# Patient Record
Sex: Male | Born: 1955 | Race: White | Hispanic: No | Marital: Married | Smoking: Current every day smoker
Health system: Southern US, Community
[De-identification: ages and names within clinical notes are randomized; demographics above are authoritative.]

## PROBLEM LIST (undated history)

## (undated) DIAGNOSIS — F1010 Alcohol abuse, uncomplicated: Secondary | ICD-10-CM

## (undated) HISTORY — PX: ORTHOPEDIC SURGERY: SHX850

## (undated) HISTORY — PX: INGUINAL HERNIA REPAIR: SUR1180

---

## 1998-10-26 ENCOUNTER — Encounter: Admission: RE | Admit: 1998-10-26 | Discharge: 1998-10-26 | Payer: Self-pay | Admitting: *Deleted

## 2000-12-20 ENCOUNTER — Encounter: Payer: Self-pay | Admitting: Family Medicine

## 2000-12-20 ENCOUNTER — Ambulatory Visit (HOSPITAL_COMMUNITY): Admission: RE | Admit: 2000-12-20 | Discharge: 2000-12-20 | Payer: Self-pay | Admitting: Family Medicine

## 2003-10-28 ENCOUNTER — Emergency Department (HOSPITAL_COMMUNITY): Admission: EM | Admit: 2003-10-28 | Discharge: 2003-10-28 | Payer: Self-pay | Admitting: Emergency Medicine

## 2015-02-27 ENCOUNTER — Emergency Department (HOSPITAL_COMMUNITY)
Admission: EM | Admit: 2015-02-27 | Discharge: 2015-02-28 | Disposition: A | Discharge status: Home / Self Care | Payer: Self-pay | Attending: Emergency Medicine | Admitting: Emergency Medicine

## 2015-02-27 ENCOUNTER — Encounter (HOSPITAL_COMMUNITY): Payer: Self-pay

## 2015-02-27 ENCOUNTER — Emergency Department (HOSPITAL_COMMUNITY): Payer: Self-pay

## 2015-02-27 DIAGNOSIS — J441 Chronic obstructive pulmonary disease with (acute) exacerbation: Secondary | ICD-10-CM | POA: Insufficient documentation

## 2015-02-27 DIAGNOSIS — F17200 Nicotine dependence, unspecified, uncomplicated: Secondary | ICD-10-CM | POA: Insufficient documentation

## 2015-02-27 DIAGNOSIS — F1020 Alcohol dependence, uncomplicated: Secondary | ICD-10-CM | POA: Insufficient documentation

## 2015-02-27 DIAGNOSIS — Z720 Tobacco use: Secondary | ICD-10-CM

## 2015-02-27 DIAGNOSIS — R079 Chest pain, unspecified: Secondary | ICD-10-CM | POA: Insufficient documentation

## 2015-02-27 HISTORY — DX: Alcohol abuse, uncomplicated: F10.10

## 2015-02-27 LAB — RAPID URINE DRUG SCREEN, HOSP PERFORMED
AMPHETAMINES: NOT DETECTED
BARBITURATES: NOT DETECTED
BENZODIAZEPINES: NOT DETECTED
COCAINE: NOT DETECTED
Opiates: NOT DETECTED
TETRAHYDROCANNABINOL: NOT DETECTED

## 2015-02-27 LAB — BASIC METABOLIC PANEL
Anion gap: 10 (ref 5–15)
BUN: 6 mg/dL (ref 6–20)
CHLORIDE: 102 mmol/L (ref 101–111)
CO2: 28 mmol/L (ref 22–32)
CREATININE: 0.71 mg/dL (ref 0.61–1.24)
Calcium: 9.4 mg/dL (ref 8.9–10.3)
GFR calc Af Amer: >60 mL/min (ref >60)
GFR calc non Af Amer: >60 mL/min (ref >60)
GLUCOSE: 91 mg/dL (ref 65–99)
POTASSIUM: 4.5 mmol/L (ref 3.5–5.1)
Sodium: 140 mmol/L (ref 135–145)

## 2015-02-27 LAB — CBC WITH DIFFERENTIAL/PLATELET
Basophils Absolute: 0.2 10*3/uL — ABNORMAL HIGH (ref 0.0–0.1)
Basophils Relative: 2 %
EOS ABS: 0.2 10*3/uL (ref 0.0–0.7)
EOS PCT: 3 %
HCT: 42.2 % (ref 39.0–52.0)
HEMOGLOBIN: 14.7 g/dL (ref 13.0–17.0)
LYMPHS ABS: 2.8 10*3/uL (ref 0.7–4.0)
LYMPHS PCT: 33 %
MCH: 32.2 pg (ref 26.0–34.0)
MCHC: 34.8 g/dL (ref 30.0–36.0)
MCV: 92.5 fL (ref 78.0–100.0)
MONOS PCT: 9 %
Monocytes Absolute: 0.8 10*3/uL (ref 0.1–1.0)
Neutro Abs: 4.4 10*3/uL (ref 1.7–7.7)
Neutrophils Relative %: 53 %
PLATELETS: 260 10*3/uL (ref 150–400)
RBC: 4.56 MIL/uL (ref 4.22–5.81)
RDW: 13.2 % (ref 11.5–15.5)
WBC: 8.3 10*3/uL (ref 4.0–10.5)

## 2015-02-27 LAB — TROPONIN I: Troponin I: <0.03 ng/mL (ref <0.031)

## 2015-02-27 LAB — ETHANOL: Alcohol, Ethyl (B): 336 mg/dL (ref <5)

## 2015-02-27 MED ORDER — FENTANYL CITRATE (PF) 100 MCG/2ML IJ SOLN
50.0000 ug | Freq: Once | INTRAMUSCULAR | Status: AC
  Administered 2015-02-27: 50 ug via INTRAVENOUS
  Filled 2015-02-27: qty 2

## 2015-02-27 NOTE — Discharge Instructions (Signed)
Call the [facility] [place] at [phone] to get a primary care doctor. Your test for a heart attack was negative tonight.

## 2015-02-27 NOTE — ED Notes (Signed)
Per EMS, called out for pt having SOB. Pt then said he has chest pain and a headache.

## 2015-02-27 NOTE — ED Provider Notes (Addendum)
CSN: [id number]     Arrival date & time 02/27/15  1631 History   First MD Initiated Contact with Patient 02/27/15 1636     Chief Complaint  Patient presents with  . Chest Pain     (Consider location/radiation/quality/duration/timing/severity/associated sxs/prior Treatment) HPI..... Sharp central chest pain for 36 hours with some radiation to the right shoulder. He is chronically short of breath secondary to COPD from smoking 2 packs of cigarettes per day. He is also heavy drinker. No history of heart disease. He claims to be homeless.  Past Medical History  Diagnosis Date  . ETOH abuse    Past Surgical History  Procedure Laterality Date  . Orthopedic surgery    . Inguinal hernia repair     No family history on file. Social History  Substance Use Topics  . Smoking status: Current Every Day Smoker  . Smokeless tobacco: None  . Alcohol Use: Yes    Review of Systems  All other systems reviewed and are negative.     Allergies  Review of patient's allergies indicates no known allergies.  Home Medications   Prior to Admission medications   Not on File   BP 147/99 mmHg  Pulse 80  Temp(Src) 97.9 F (36.6 C) (Oral)  Resp 16  Ht 6\' 1"  (1.854 m)  Wt 140 lb (63.504 kg)  BMI 18.47 kg/m2  SpO2 100% Physical Exam  Constitutional: He is oriented to person, place, and time. He appears well-developed and well-nourished.  HENT:  Head: Normocephalic and atraumatic.  Eyes: Conjunctivae and EOM are normal. Pupils are equal, round, and reactive to light.  Neck: Normal range of motion. Neck supple.  Cardiovascular: Normal rate and regular rhythm.   Pulmonary/Chest: Effort normal and breath sounds normal.  Abdominal: Soft. Bowel sounds are normal.  Musculoskeletal: Normal range of motion.  Neurological: He is alert and oriented to person, place, and time.  Skin: Skin is warm and dry.  Psychiatric: He has a normal mood and affect. His behavior is normal.  Nursing note and vitals  reviewed.   ED Course  Procedures (including critical care time) Labs Review Labs Reviewed  CBC WITH DIFFERENTIAL/PLATELET - Abnormal; Notable for the following:    Basophils Absolute 0.2 (*)    All other components within normal limits  ETHANOL - Abnormal; Notable for the following:    Alcohol, Ethyl (B) 336 (*)    All other components within normal limits  BASIC METABOLIC PANEL  TROPONIN I  URINE RAPID DRUG SCREEN, HOSP PERFORMED    Imaging Review Dg Chest Port 1 View  02/27/2015  CLINICAL DATA:  Shortness of breath.  Left chest pain.  Smoker. EXAM: PORTABLE CHEST 1 VIEW COMPARISON:  None. FINDINGS: Borderline enlarged cardiac silhouette. Clear lungs with normal vascularity. The lungs are mildly hyperexpanded. Mild scoliosis. IMPRESSION: No acute abnormality. Borderline cardiomegaly and mild changes of COPD. Electronically Signed   By: Beckie SaltsSteven  Reid M.D.   On: 02/27/2015 17:41   I have personally reviewed and evaluated these images and lab results as part of my medical decision-making.   EKG Interpretation   Date/Time:  Saturday February 27 2015 16:36:27 EST Ventricular Rate:  66 PR Interval:  147 QRS Duration: 83 QT Interval:  401 QTC Calculation: 420 R Axis:   83 Text Interpretation:  Sinus rhythm Anterior infarct, old Confirmed by Omarius Grantham   MD, Alois Mincer ([id number]) on 02/27/2015 4:39:59 PM      MDM   Final diagnoses:  Chest pain, unspecified chest pain type  Alcoholism (HCC)  Tobacco abuse    Screening tests including EKG and troponin were negative. Alcohol level was 336. After patient slept for a couple hours, he was pain-free. He will seek primary care follow-up    Donnetta Hutching, MD 02/27/15 [id number]  Donnetta Hutching, MD 02/28/15 [phone]

## 2015-02-27 NOTE — ED Notes (Signed)
Received report on pt, pt alert, requesting something to drink, denies any pain, update given,

## 2015-02-28 NOTE — ED Notes (Signed)
Pt discharged to lobby to wait for morning,

## 2017-03-29 IMAGING — CR DG CHEST 1V PORT
1 series · 1 of 1 positions shown · non-contrast
Comparison: None.

CLINICAL DATA: Shortness of breath.  Left chest pain.  Smoker.

EXAM:
PORTABLE CHEST 1 VIEW

[ap portable]
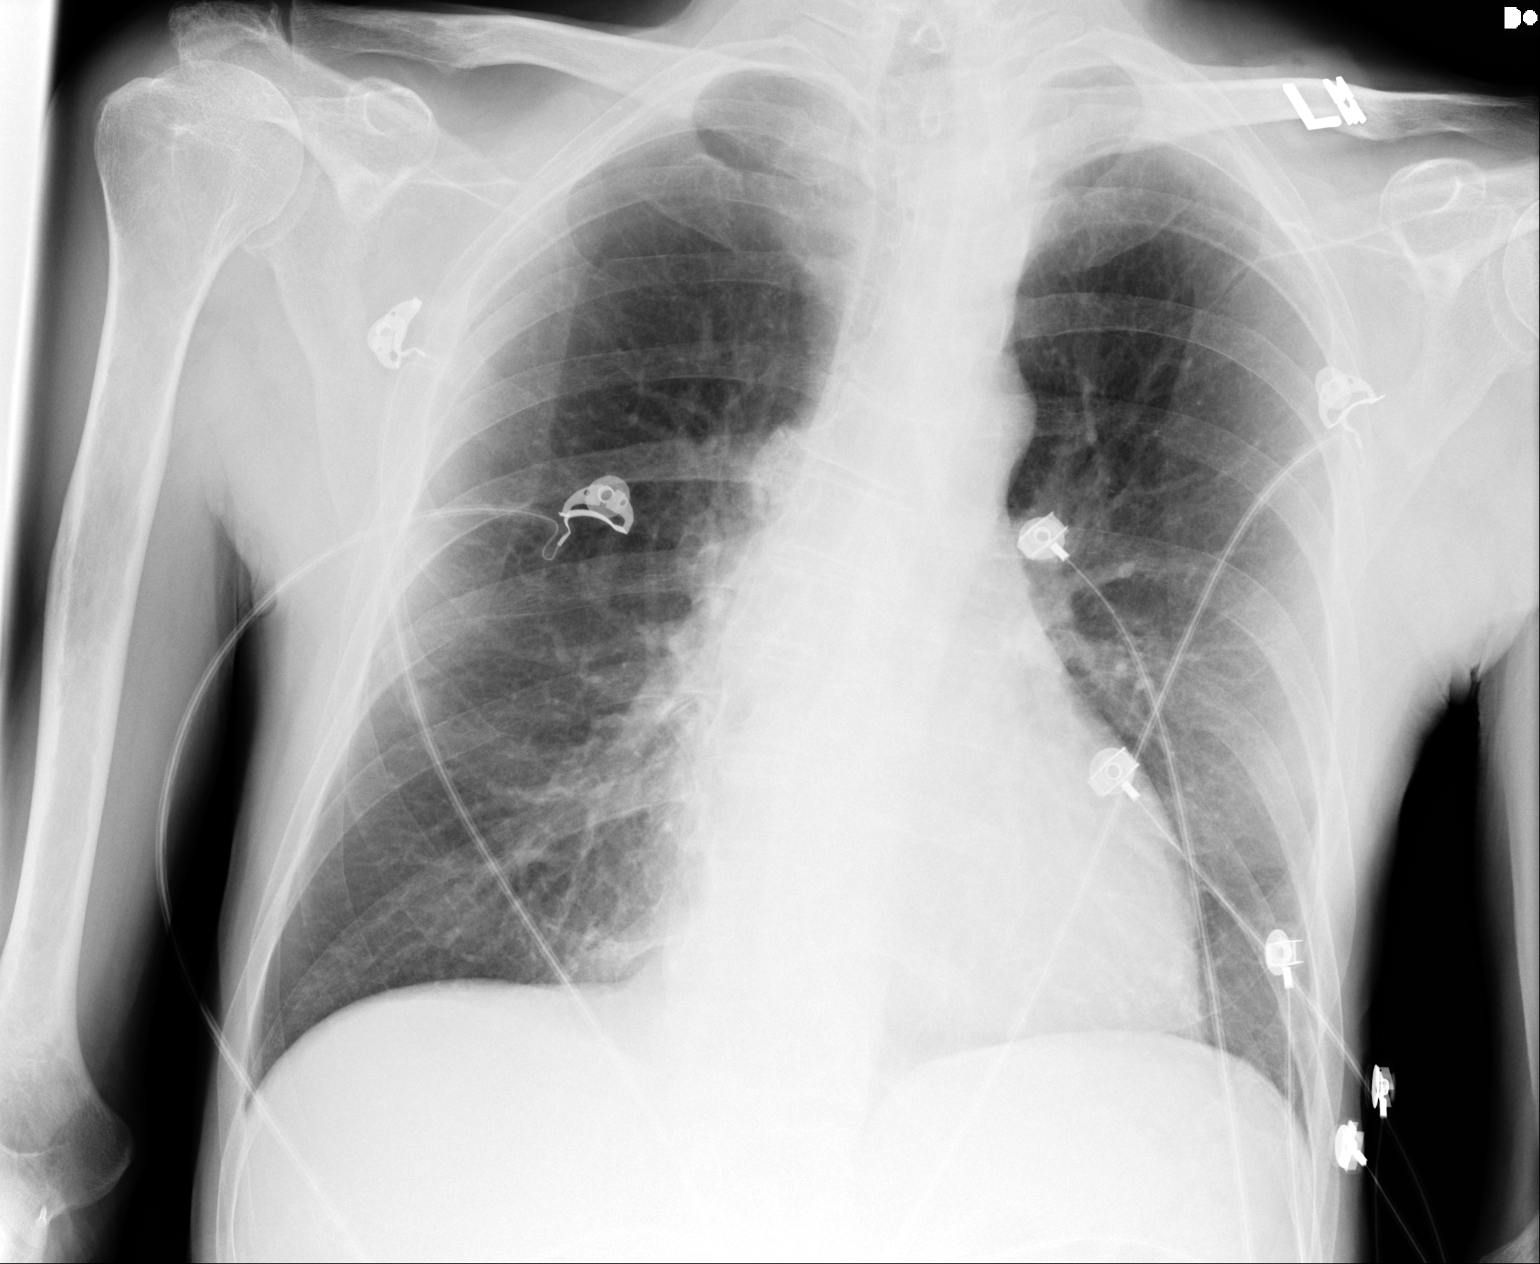

[1 of 1 positions shown; findings below may reference images not displayed]

FINDINGS: Borderline enlarged cardiac silhouette. Clear lungs with normal
vascularity. The lungs are mildly hyperexpanded. Mild scoliosis.
IMPRESSION: No acute abnormality. Borderline cardiomegaly and mild changes of
COPD.

## 2017-06-25 DEATH — deceased
